# Patient Record
Sex: Female | Born: 1945
Health system: Southern US, Community
[De-identification: ages and names within clinical notes are randomized; demographics above are authoritative.]

---

## 2010-04-21 ENCOUNTER — Ambulatory Visit: Payer: Self-pay | Admitting: Psychology

## 2010-08-05 ENCOUNTER — Ambulatory Visit: Payer: Self-pay | Admitting: Psychology

## 2012-05-09 ENCOUNTER — Other Ambulatory Visit (HOSPITAL_COMMUNITY): Payer: Self-pay | Admitting: Neurology

## 2012-05-09 DIAGNOSIS — F0390 Unspecified dementia without behavioral disturbance: Secondary | ICD-10-CM

## 2012-05-18 ENCOUNTER — Encounter (HOSPITAL_COMMUNITY)
Admission: RE | Admit: 2012-05-18 | Discharge: 2012-05-18 | Disposition: A | Discharge status: Home / Self Care | Payer: Self-pay | Source: Ambulatory Visit | Attending: Neurology | Admitting: Neurology

## 2012-05-18 DIAGNOSIS — F0390 Unspecified dementia without behavioral disturbance: Secondary | ICD-10-CM | POA: Insufficient documentation

## 2012-05-18 DIAGNOSIS — R413 Other amnesia: Secondary | ICD-10-CM | POA: Insufficient documentation

## 2013-04-05 IMAGING — CT NM PET BRAIN AMYLOID
1 of 7 series · 1 of 25 positions shown · non-contrast
Comparison: None.

CLINICAL DATA: Memory loss, evaluate for amyloid plaque

NUCLEAR MEDICINE BRAIN PET CT
TECHNIQUE: 10.6  mCi F-18 Florbetapir was injected intravenously
via the right antecubital fossa. Full-ring PET imaging was
performed from the vertex to the skull base 40 minutes after
injection. CT data was obtained and used for attenuation correction
and anatomic localization only. (This was not acquired as a
diagnostic CT examination.)

[Series 2: ct brain · axial · 3.8mm · 0.98mm/px · 1 of 47 slices shown]
[im 24/47  brain]
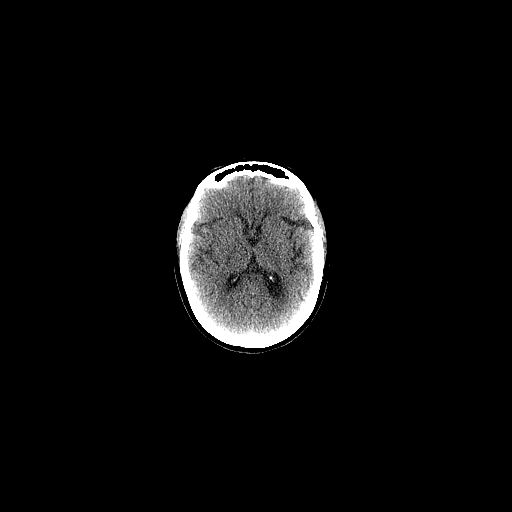

[1 of 25 positions shown; findings below may reference images not displayed]

FINDINGS: There is no increased florbetapir uptake seen in the
cortical cerebral gray matter.  The brain shows grey-white contrast
throughout temporal lobes, occipital lobes, parietal lobes, and
frontal lobes.  The cerebellum has no  abnormal uptake.
IMPRESSION: Scan is negative indicating only sparse or no neuritic beta-amyloid
plaques which is inconsistent with a pathologic diagnosis of
Alzheimer's disease.

Note: Florbetapir is a radiopharmaceutical indicated for Po[REDACTED]

Emission Tomography (PET) imaging of beta-amyloid neuritic plaques
in

the brains of adult patients with cognitive impairment being

evaluated for suspected Alzheimer's disease (AD). A positive scan

indicates moderate to frequent plaques, which demonstrates the

presence of AD pathology. A negative scan indicates sparse or no

plaques, which is inconsistent with a diagnosis of AD. Florbetapir

is an adjunct to other diagnostic evaluations.

## 2018-11-12 ENCOUNTER — Other Ambulatory Visit: Payer: Self-pay

## 2018-11-12 MED ORDER — VENLAFAXINE HCL 100 MG PO TABS: 100.0000 mg | ORAL_TABLET | Freq: Two times a day (BID) | ORAL | 1 refills | Status: DC

## 2018-11-13 ENCOUNTER — Encounter: Payer: Self-pay | Admitting: Emergency Medicine

## 2018-11-13 DIAGNOSIS — F0280 Dementia in other diseases classified elsewhere without behavioral disturbance: Secondary | ICD-10-CM | POA: Insufficient documentation

## 2018-11-13 DIAGNOSIS — G309 Alzheimer's disease, unspecified: Secondary | ICD-10-CM

## 2018-11-13 DIAGNOSIS — F424 Excoriation (skin-picking) disorder: Secondary | ICD-10-CM | POA: Insufficient documentation

## 2018-11-16 ENCOUNTER — Other Ambulatory Visit: Payer: Self-pay

## 2018-11-16 MED ORDER — OLANZAPINE 2.5 MG PO TABS: ORAL_TABLET | ORAL | 1 refills | Status: DC

## 2019-02-28 ENCOUNTER — Ambulatory Visit: Payer: Medicare Other | Admitting: Psychiatry

## 2019-02-28 ENCOUNTER — Encounter: Payer: Self-pay | Admitting: Psychiatry

## 2019-02-28 DIAGNOSIS — F411 Generalized anxiety disorder: Secondary | ICD-10-CM | POA: Diagnosis not present

## 2019-02-28 DIAGNOSIS — G301 Alzheimer's disease with late onset: Secondary | ICD-10-CM

## 2019-02-28 DIAGNOSIS — F3342 Major depressive disorder, recurrent, in full remission: Secondary | ICD-10-CM

## 2019-02-28 DIAGNOSIS — F0280 Dementia in other diseases classified elsewhere without behavioral disturbance: Secondary | ICD-10-CM

## 2019-02-28 MED ORDER — MEMANTINE HCL ER 28 MG PO CP24: 28.0000 mg | ORAL_CAPSULE | Freq: Every day | ORAL | 5 refills | Status: DC

## 2019-02-28 MED ORDER — LORAZEPAM 0.5 MG PO TABS: 0.5000 mg | ORAL_TABLET | Freq: Three times a day (TID) | ORAL | 5 refills | Status: DC

## 2019-02-28 MED ORDER — RIVASTIGMINE 13.3 MG/24HR TD PT24: 13.3000 mg | MEDICATED_PATCH | Freq: Every day | TRANSDERMAL | 5 refills | Status: DC

## 2019-02-28 MED ORDER — VENLAFAXINE HCL 100 MG PO TABS: 100.0000 mg | ORAL_TABLET | Freq: Two times a day (BID) | ORAL | 1 refills | Status: DC

## 2019-02-28 NOTE — Progress Notes (Signed)
Melinda Cooper [id number] Oct 19, 1946 73 y.o.  Subjective:   Patient ID:  Melinda Cooper is a 73 y.o. (DOB 24-Mar-1946) female.  Chief Complaint:  Chief Complaint  Patient presents with  . Follow-up    Medication Management  . Memory Loss   Last seen September. HPI seen with her daughter and her son-in-law Melinda Cooper presents to the office today for follow-up of Alzheimer's disease, chronic major depression, chronic anxiety.  No pain, fear, sad.  Doesn't know if she's confused. Denies memory problems.    Problems with sleep issues.  Falls asleep abruptly and doesn't maintain posture even when awake.  Drastic decline since here.  Eating has changed with less capacity to eat.  May or may not use utensils when eating.  Intermittent ability to eat.  Appetite swings.  Lost some weight.  Not choking.  Requires direction for all ADL.  Occ can urinate without direction but usually not.  Cannot bathe herself.  Can pull on pants but not shirt nor shoes.  Lately waking in middle of the night and wandering in the house.  Ankles swelling.  Fidgety.   Did not tolerate the attempted reduction of lorazepam.  It was helllacious!  Was belligerent and up all night.  Anxiety on speed.   Review of Systems:  Review of Systems  Cardiovascular: Positive for leg swelling.  Psychiatric/Behavioral: Positive for confusion, decreased concentration and sleep disturbance. Negative for agitation, behavioral problems, dysphoric mood, hallucinations, self-injury and suicidal ideas. The patient is not nervous/anxious and is not hyperactive.     Medications: I have reviewed the patient's current medications.  Current Outpatient Medications  Medication Sig Dispense Refill  . LORazepam (ATIVAN) 0.5 MG tablet Take 1 tablet (0.5 mg total) by mouth every 8 (eight) hours. 1 am 1 Pm 90 tablet 5  . memantine (NAMENDA XR) 28 MG CP24 24 hr capsule Take 1 capsule (28 mg total) by mouth daily. 30 capsule 5  . metoprolol  succinate (TOPROL-XL) 50 MG 24 hr tablet Take 50 mg by mouth daily.    Marland Kitchen OLANZapine (ZYPREXA) 2.5 MG tablet Take 1 1/2 tablets at bedtime. 135 tablet 1  . Omega-3 Fatty Acids (FISH OIL) 1000 MG CAPS Take 1,000 mg by mouth.    . rivastigmine (EXELON) 13.3 MG/24HR Place 1 patch (13.3 mg total) onto the skin daily. 30 patch 5  . venlafaxine (EFFEXOR) 100 MG tablet Take 1 tablet (100 mg total) by mouth 2 (two) times daily. 180 tablet 1   No current facility-administered medications for this visit.     Medication Side Effects: None  Allergies: No Known Allergies  History reviewed. No pertinent past medical history.  History reviewed. No pertinent family history.  Social History   Socioeconomic History  . Marital status: Single    Spouse name: Not on file  . Number of children: Not on file  . Years of education: Not on file  . Highest education level: Not on file  Occupational History  . Not on file  Social Needs  . Financial resource strain: Not on file  . Food insecurity:    Worry: Not on file    Inability: Not on file  . Transportation needs:    Medical: Not on file    Non-medical: Not on file  Tobacco Use  . Smoking status: Former Games developer  . Smokeless tobacco: Never Used  Substance and Sexual Activity  . Alcohol use: Not on file  . Drug use: Not on file  .  Sexual activity: Not on file  Lifestyle  . Physical activity:    Days per week: Not on file    Minutes per session: Not on file  . Stress: Not on file  Relationships  . Social connections:    Talks on phone: Not on file    Gets together: Not on file    Attends religious service: Not on file    Active member of club or organization: Not on file    Attends meetings of clubs or organizations: Not on file    Relationship status: Not on file  . Intimate partner violence:    Fear of current or ex partner: Not on file    Emotionally abused: Not on file    Physically abused: Not on file    Forced sexual activity: Not  on file  Other Topics Concern  . Not on file  Social History Narrative  . Not on file    Past Medical History, Surgical history, Social history, and Family history were reviewed and updated as appropriate.   Please see review of systems for further details on the patient's review from today.   Objective:   Physical Exam:  There were no vitals taken for this visit.  Physical Exam Constitutional:      Appearance: Normal appearance.  Neurological:     Mental Status: She is disoriented and confused.     Motor: Weakness present. No tremor.     Coordination: Coordination abnormal.     Comments: Needs assistance walking.  Psychiatric:        Attention and Perception: She is inattentive. She does not perceive auditory or visual hallucinations.        Mood and Affect: Mood is not anxious or depressed. Affect is blunt. Affect is not angry or tearful.        Speech: She is noncommunicative. Speech is delayed. Speech is not slurred.        Behavior: Behavior is slowed and withdrawn. Behavior is not agitated or aggressive.        Thought Content: Thought content is not paranoid or delusional. Thought content does not include homicidal or suicidal ideation.        Cognition and Memory: Cognition is impaired. Memory is impaired. She exhibits impaired recent memory and impaired remote memory.     Comments: Patient is completely disoriented.  She can only answer yes/no questions and answers those inappropriately.  There is no spontaneous speech.     Lab Review:  No results found for: NA, K, CL, CO2, GLUCOSE, BUN, CREATININE, CALCIUM, PROT, ALBUMIN, AST, ALT, ALKPHOS, BILITOT, GFRNONAA, GFRAA  No results found for: WBC, RBC, HGB, HCT, PLT, MCV, MCH, MCHC, RDW, LYMPHSABS, MONOABS, EOSABS, BASOSABS  No results found for: POCLITH, LITHIUM   No results found for: PHENYTOIN, PHENOBARB, VALPROATE, CBMZ   .res Assessment: Plan:    Late onset Alzheimer's disease without behavioral disturbance  (HCC)  Generalized anxiety disorder  Recurrent major depression in complete remission (HCC)   Greater than 50% of face to face time with patient was spent on counseling and coordination of care. We discussed the patient has severe Alzheimer's disease.  She has a history of major depression and anxiety but these do not appear to be problematic at this time because of the severity of cognitive decline.  Her daughter and her son-in-law were present and had extensive questions about the patient's current status, likely progression, placement issues and when to place.  We discussed whether there was value  in continuing medications given her advanced Alzheimer's disease.  Primary care doctor had stopped suggested stopping metoprolol and I concur.  We discussed stopping the Exelon patch and Namenda.  We discussed how after discontinuing the Exelon patch if patient declines abruptly she may not regain those losses even if the medication is restarted.  We discussed the timing of discontinuing these medications.  When the daughter is out of teaching in the summer she will discontinue the Namenda over the course of 1 month and then discontinue the Exelon patch.  This appt was 45 mins.  FU August after they start weaning Namenda first in June then pExelon in  De [place]ue, MD, DFAPA  Please see After Visit Summary for patient specific instructions.  Future Appointments  Date Time Provider Department Center  08/01/2019  3:00 PM Cottle, Steva Ready., MD [facility] None    No orders of the defined types were placed in this encounter.     -------------------------------

## 2019-05-07 ENCOUNTER — Other Ambulatory Visit: Payer: Self-pay | Admitting: Psychiatry

## 2019-07-19 ENCOUNTER — Other Ambulatory Visit: Payer: Self-pay

## 2019-07-19 MED ORDER — MEMANTINE HCL ER 28 MG PO CP24: 28.0000 mg | ORAL_CAPSULE | Freq: Every day | ORAL | 3 refills | Status: AC

## 2019-08-01 ENCOUNTER — Ambulatory Visit (INDEPENDENT_AMBULATORY_CARE_PROVIDER_SITE_OTHER): Payer: Medicare Other | Admitting: Psychiatry

## 2019-08-01 ENCOUNTER — Encounter: Payer: Self-pay | Admitting: Psychiatry

## 2019-08-01 ENCOUNTER — Other Ambulatory Visit: Payer: Self-pay

## 2019-08-01 DIAGNOSIS — F411 Generalized anxiety disorder: Secondary | ICD-10-CM | POA: Diagnosis not present

## 2019-08-01 DIAGNOSIS — F3342 Major depressive disorder, recurrent, in full remission: Secondary | ICD-10-CM

## 2019-08-01 DIAGNOSIS — G301 Alzheimer's disease with late onset: Secondary | ICD-10-CM | POA: Diagnosis not present

## 2019-08-01 DIAGNOSIS — F0280 Dementia in other diseases classified elsewhere without behavioral disturbance: Secondary | ICD-10-CM | POA: Diagnosis not present

## 2019-08-01 MED ORDER — LORAZEPAM 0.5 MG PO TABS: 0.5000 mg | ORAL_TABLET | Freq: Three times a day (TID) | ORAL | 5 refills | Status: AC

## 2019-08-01 MED ORDER — VENLAFAXINE HCL 100 MG PO TABS: 100.0000 mg | ORAL_TABLET | Freq: Two times a day (BID) | ORAL | 1 refills | Status: AC

## 2019-08-01 MED ORDER — OLANZAPINE 2.5 MG PO TABS: ORAL_TABLET | ORAL | 1 refills | Status: DC

## 2019-08-01 NOTE — Patient Instructions (Signed)
Reduce Namenda to 1/2 capsule daily for 1 month and then stop it.  Wait 2 weeks then cut Exelon patch in 1/2 for 4 weeks and stop it.

## 2019-08-01 NOTE — Progress Notes (Signed)
Melinda Cooper [id number] [birth year] 73 y.o.  Subjective:   Patient ID:  Melinda Cooper is a 73 y.o. (DOB 1946/11/09) female.  Chief Complaint:  Chief Complaint  Patient presents with  . Follow-up    Medication Management  . Memory Loss    HPI seen with her daughter and her son-in-law Melinda Cooper presents to the office today for follow-up of Alzheimer's disease, chronic major depression, chronic anxiety.  Last seen February 28, 2019.  Because of advancing Alzheimer's disease there was a discussion about discontinuing Namenda and Exelon at the last visit.  There were no other med changes.  Asks about stopping meds. Often cannot feed herself.  Cannot cut food.  At times doesn't recognize utensils.  Can still walk.  Incontinent for a long time.   No pain, fear, sad.  Doesn't know if she's confused.   Problems with sleep issues.  Wants to sleep all day.  Family afraid of recurrent hallucinations if stop olanzapine.  Falls asleep abruptly and doesn't maintain posture even when awake.  Drastic decline since here.  Eating has changed with less capacity to eat.  May or may not use utensils when eating.  Intermittent ability to eat.  Appetite swings.  Lost some weight.  Not choking.  Requires direction for all ADL.  Occ can urinate without direction but usually not.  Cannot bathe herself.  Can pull on pants but not shirt nor shoes.  Lately waking in middle of the night and wandering in the house.  Ankles swelling.  Fidgety.   Did not tolerate the attempted reduction of lorazepam.  It was helllacious!  Was belligerent and up all night.  Anxiety on speed.   Review of Systems:  Review of Systems  Cardiovascular: Positive for leg swelling.  Psychiatric/Behavioral: Positive for confusion, decreased concentration and sleep disturbance. Negative for agitation, behavioral problems, dysphoric mood, hallucinations, self-injury and suicidal ideas. The patient is not nervous/anxious and is not  hyperactive.     Medications: I have reviewed the patient's current medications.  Current Outpatient Medications  Medication Sig Dispense Refill  . LORazepam (ATIVAN) 0.5 MG tablet Take 1 tablet (0.5 mg total) by mouth every 8 (eight) hours. 1 am 1 Pm 90 tablet 5  . memantine (NAMENDA XR) 28 MG CP24 24 hr capsule Take 1 capsule (28 mg total) by mouth daily. 90 capsule 3  . metoprolol succinate (TOPROL-XL) 50 MG 24 hr tablet Take 25 mg by mouth daily.     Marland Kitchen OLANZapine (ZYPREXA) 2.5 MG tablet TAKE 1 AND 1/2 TABLETS AT BEDTIME 135 tablet 1  . Omega-3 Fatty Acids (FISH OIL) 1000 MG CAPS Take 1,000 mg by mouth.    . rivastigmine (EXELON) 13.3 MG/24HR Place 1 patch (13.3 mg total) onto the skin daily. 30 patch 5  . venlafaxine (EFFEXOR) 100 MG tablet Take 1 tablet (100 mg total) by mouth 2 (two) times daily. 180 tablet 1   No current facility-administered medications for this visit.     Medication Side Effects: None  Allergies: No Known Allergies  History reviewed. No pertinent past medical history.  History reviewed. No pertinent family history.  Social History   Socioeconomic History  . Marital status: Single    Spouse name: Not on file  . Number of children: Not on file  . Years of education: Not on file  . Highest education level: Not on file  Occupational History  . Not on file  Social Needs  . Financial resource strain: Not on  file  . Food insecurity    Worry: Not on file    Inability: Not on file  . Transportation needs    Medical: Not on file    Non-medical: Not on file  Tobacco Use  . Smoking status: Former Games developermoker  . Smokeless tobacco: Never Used  Substance and Sexual Activity  . Alcohol use: Not on file  . Drug use: Not on file  . Sexual activity: Not on file  Lifestyle  . Physical activity    Days per week: Not on file    Minutes per session: Not on file  . Stress: Not on file  Relationships  . Social Musicianconnections    Talks on phone: Not on file    Gets  together: Not on file    Attends religious service: Not on file    Active member of club or organization: Not on file    Attends meetings of clubs or organizations: Not on file    Relationship status: Not on file  . Intimate partner violence    Fear of current or ex partner: Not on file    Emotionally abused: Not on file    Physically abused: Not on file    Forced sexual activity: Not on file  Other Topics Concern  . Not on file  Social History Narrative  . Not on file    Past Medical History, Surgical history, Social history, and Family history were reviewed and updated as appropriate.   Please see review of systems for further details on the patient's review from today.   Objective:   Physical Exam:  There were no vitals taken for this visit.  Physical Exam Constitutional:      Appearance: Normal appearance.  Neurological:     Mental Status: She is disoriented and confused.     Motor: Weakness present. No tremor.     Coordination: Coordination abnormal.     Comments: Needs assistance walking.  Psychiatric:        Attention and Perception: She is inattentive. She does not perceive auditory or visual hallucinations.        Mood and Affect: Mood is not anxious or depressed. Affect is blunt. Affect is not angry or tearful.        Speech: She is noncommunicative. Speech is delayed. Speech is not slurred.        Behavior: Behavior is slowed and withdrawn. Behavior is not agitated or aggressive.        Thought Content: Thought content is not paranoid or delusional. Thought content does not include homicidal or suicidal ideation.        Cognition and Memory: Cognition is impaired. Memory is impaired. She exhibits impaired recent memory and impaired remote memory.     Comments: Patient is completely disoriented including does not know her own name..  She can only answer yes/no questions and answers those inappropriately.  There is no spontaneous speech.     Lab Review:  No  results found for: NA, K, CL, CO2, GLUCOSE, BUN, CREATININE, CALCIUM, PROT, ALBUMIN, AST, ALT, ALKPHOS, BILITOT, GFRNONAA, GFRAA  No results found for: WBC, RBC, HGB, HCT, PLT, MCV, MCH, MCHC, RDW, LYMPHSABS, MONOABS, EOSABS, BASOSABS  No results found for: POCLITH, LITHIUM   No results found for: PHENYTOIN, PHENOBARB, VALPROATE, CBMZ   .res Assessment: Plan:    Melinda Cooper was seen today for follow-up and memory loss.  Diagnoses and all orders for this visit:  Late onset Alzheimer's disease without behavioral disturbance (HCC)  Generalized  anxiety disorder  Recurrent major depression in complete remission (HCC)  Greater than 50% of face to face time with patient was spent on counseling and coordination of care. We discussed the patient has severe Alzheimer's disease.  She has a history of major depression and anxiety but these do not appear to be problematic at this time because of the severity of cognitive decline.  Her daughter and her son-in-law were present and had extensive questions about the patient's current status, likely progression, placement issues and when to place.  We discussed whether there was value in continuing medications given her advanced Alzheimer's disease.  Primary care doctor had stopped suggested stopping metoprolol and I concur.  We discussed stopping the Exelon patch and Namenda.  We discussed how after discontinuing the Exelon patch if patient declines abruptly she may not regain those losses even if the medication is restarted.  We discussed the timing of discontinuing these medications.  Disc the risk of stopping the meds to protect her cognitive status vs stopping unneeded medications.  Disc risk that stopping Exelon could lead to losses that are irresversible.  Disc pros and cons of each option.  They will decide about the Exelon.  Reduce Namenda to 1/2 capsule daily for 1 month and then stop it. Wait 2 weeks then cut Exelon patch in 1/2 for 4 weeks and stop  it.  Disc caretaking options.  Disc NHP issues.  This appt was 45 mins.  FU 6 mos  Iona Hansenarey, MD, DFAPA  Please see After Visit Summary for patient specific instructions.  No future appointments.  No orders of the defined types were placed in this encounter.     -------------------------------

## 2019-08-19 ENCOUNTER — Other Ambulatory Visit: Payer: Self-pay | Admitting: Psychiatry

## 2019-08-19 DIAGNOSIS — F411 Generalized anxiety disorder: Secondary | ICD-10-CM

## 2019-08-19 DIAGNOSIS — F3342 Major depressive disorder, recurrent, in full remission: Secondary | ICD-10-CM

## 2019-08-20 ENCOUNTER — Telehealth: Payer: Self-pay | Admitting: Psychiatry

## 2019-08-20 NOTE — Telephone Encounter (Signed)
Refill sent previously

## 2019-08-20 NOTE — Telephone Encounter (Signed)
Patient's daughter states patient need authorization for Alazepine 2.5 mg 1/2 pill at night to be sent to [facility] on [address] in [place]

## 2019-11-06 ENCOUNTER — Other Ambulatory Visit: Payer: Self-pay | Admitting: Psychiatry

## 2019-12-31 ENCOUNTER — Telehealth: Payer: Self-pay | Admitting: Psychiatry

## 2019-12-31 NOTE — Telephone Encounter (Signed)
Daughter of Melinda Cooper called to let us know she is receiving hospice care now, is in end stage dementia. Thank you so much for all you've done for her.

## 2020-01-27 DEATH — deceased

## 2020-01-30 ENCOUNTER — Ambulatory Visit: Payer: Medicare Other | Admitting: Psychiatry

## 2020-02-03 ENCOUNTER — Other Ambulatory Visit: Payer: Self-pay | Admitting: Psychiatry

## 2020-02-03 DIAGNOSIS — F411 Generalized anxiety disorder: Secondary | ICD-10-CM

## 2020-02-03 DIAGNOSIS — F3342 Major depressive disorder, recurrent, in full remission: Secondary | ICD-10-CM
